# Patient Record
Sex: Female | Born: 1965 | Race: White | Hispanic: No | Marital: Married | Smoking: Never smoker
Health system: Southern US, Community
[De-identification: ages and names within clinical notes are randomized; demographics above are authoritative.]

## PROBLEM LIST (undated history)

## (undated) DIAGNOSIS — E785 Hyperlipidemia, unspecified: Secondary | ICD-10-CM

## (undated) DIAGNOSIS — E079 Disorder of thyroid, unspecified: Secondary | ICD-10-CM

## (undated) HISTORY — DX: Hyperlipidemia, unspecified: E78.5

## (undated) HISTORY — DX: Disorder of thyroid, unspecified: E07.9

## (undated) HISTORY — PX: FOOT SURGERY: SHX648

---

## 1998-10-12 ENCOUNTER — Other Ambulatory Visit: Admission: RE | Admit: 1998-10-12 | Discharge: 1998-10-12 | Payer: Self-pay | Admitting: Obstetrics and Gynecology

## 1999-08-07 ENCOUNTER — Other Ambulatory Visit: Admission: RE | Admit: 1999-08-07 | Discharge: 1999-08-07 | Payer: Self-pay | Admitting: Obstetrics and Gynecology

## 2000-02-16 ENCOUNTER — Encounter (INDEPENDENT_AMBULATORY_CARE_PROVIDER_SITE_OTHER): Payer: Self-pay | Admitting: Specialist

## 2000-02-16 ENCOUNTER — Inpatient Hospital Stay (HOSPITAL_COMMUNITY): Admission: AD | Admit: 2000-02-16 | Discharge: 2000-02-20 | Payer: Self-pay | Admitting: Obstetrics and Gynecology

## 2000-04-01 ENCOUNTER — Other Ambulatory Visit: Admission: RE | Admit: 2000-04-01 | Discharge: 2000-04-01 | Payer: Self-pay | Admitting: Obstetrics and Gynecology

## 2001-04-23 ENCOUNTER — Other Ambulatory Visit: Admission: RE | Admit: 2001-04-23 | Discharge: 2001-04-23 | Payer: Self-pay | Admitting: Obstetrics and Gynecology

## 2001-07-13 ENCOUNTER — Ambulatory Visit (HOSPITAL_COMMUNITY): Admission: RE | Admit: 2001-07-13 | Discharge: 2001-07-13 | Payer: Self-pay | Admitting: Family Medicine

## 2001-07-13 ENCOUNTER — Encounter: Payer: Self-pay | Admitting: Family Medicine

## 2002-05-19 ENCOUNTER — Other Ambulatory Visit: Admission: RE | Admit: 2002-05-19 | Discharge: 2002-05-19 | Payer: Self-pay | Admitting: Obstetrics and Gynecology

## 2002-06-03 ENCOUNTER — Encounter: Payer: Self-pay | Admitting: Cardiovascular Disease

## 2002-06-03 ENCOUNTER — Ambulatory Visit: Admission: RE | Admit: 2002-06-03 | Discharge: 2002-06-03 | Payer: Self-pay | Admitting: Obstetrics and Gynecology

## 2003-06-17 ENCOUNTER — Other Ambulatory Visit: Admission: RE | Admit: 2003-06-17 | Discharge: 2003-06-17 | Payer: Self-pay | Admitting: Obstetrics & Gynecology

## 2004-07-17 ENCOUNTER — Other Ambulatory Visit: Admission: RE | Admit: 2004-07-17 | Discharge: 2004-07-17 | Payer: Self-pay | Admitting: Obstetrics and Gynecology

## 2005-07-09 ENCOUNTER — Other Ambulatory Visit: Admission: RE | Admit: 2005-07-09 | Discharge: 2005-07-09 | Payer: Self-pay | Admitting: Obstetrics and Gynecology

## 2007-11-30 ENCOUNTER — Emergency Department (HOSPITAL_COMMUNITY): Admission: EM | Admit: 2007-11-30 | Discharge: 2007-11-30 | Payer: Self-pay | Admitting: Emergency Medicine

## 2009-01-06 ENCOUNTER — Emergency Department (HOSPITAL_BASED_OUTPATIENT_CLINIC_OR_DEPARTMENT_OTHER): Admission: EM | Admit: 2009-01-06 | Discharge: 2009-01-06 | Payer: Self-pay | Admitting: Emergency Medicine

## 2009-01-06 ENCOUNTER — Ambulatory Visit: Payer: Self-pay | Admitting: Diagnostic Radiology

## 2009-01-12 HISTORY — PX: KNEE SURGERY: SHX244

## 2010-07-30 IMAGING — CR DG KNEE COMPLETE 4+V*R*
4 series · 4 of 4 positions shown · non-contrast
Comparison: None.

CLINICAL DATA: Right knee pain following an injury.

RIGHT KNEE - COMPLETE 4+ VIEW

[t knee lat right]
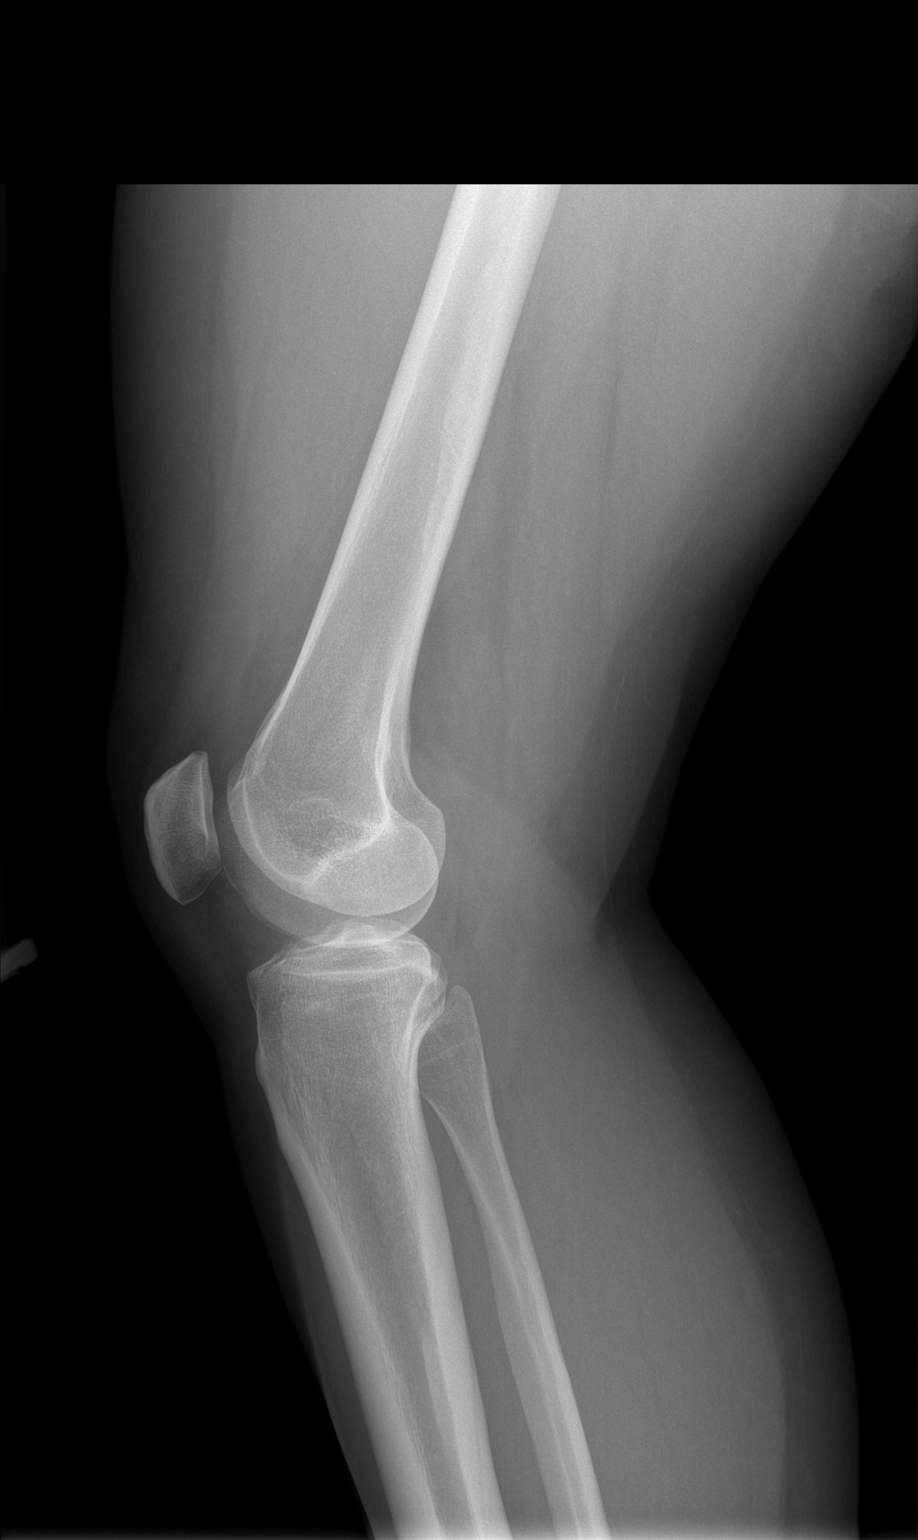

[t knee ap right]
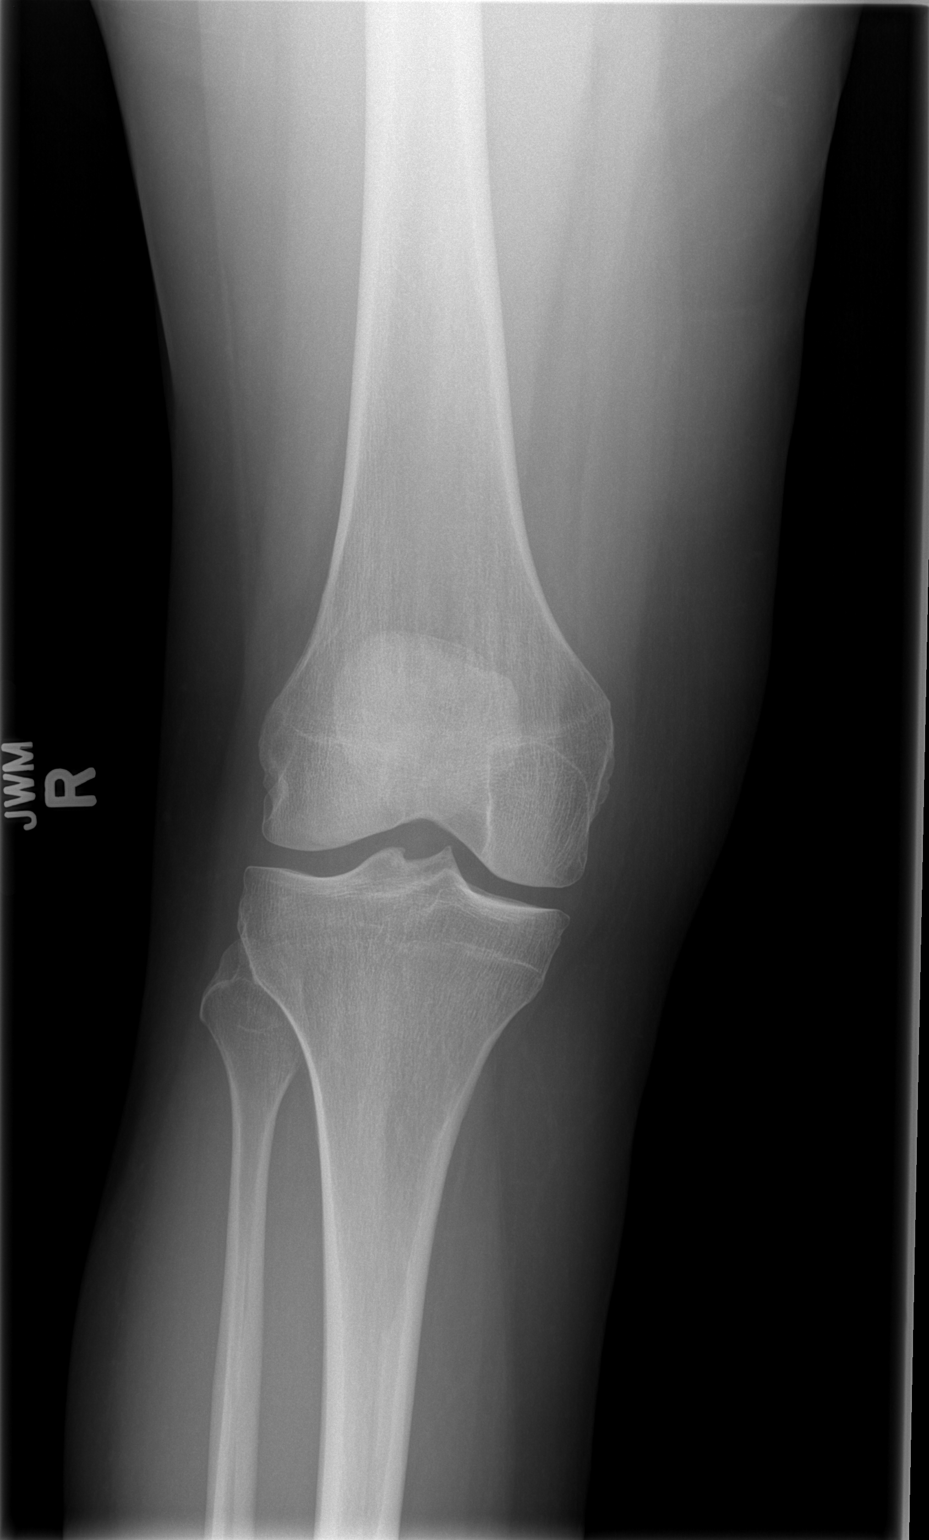

[t knee oblique right (1 of 2)]
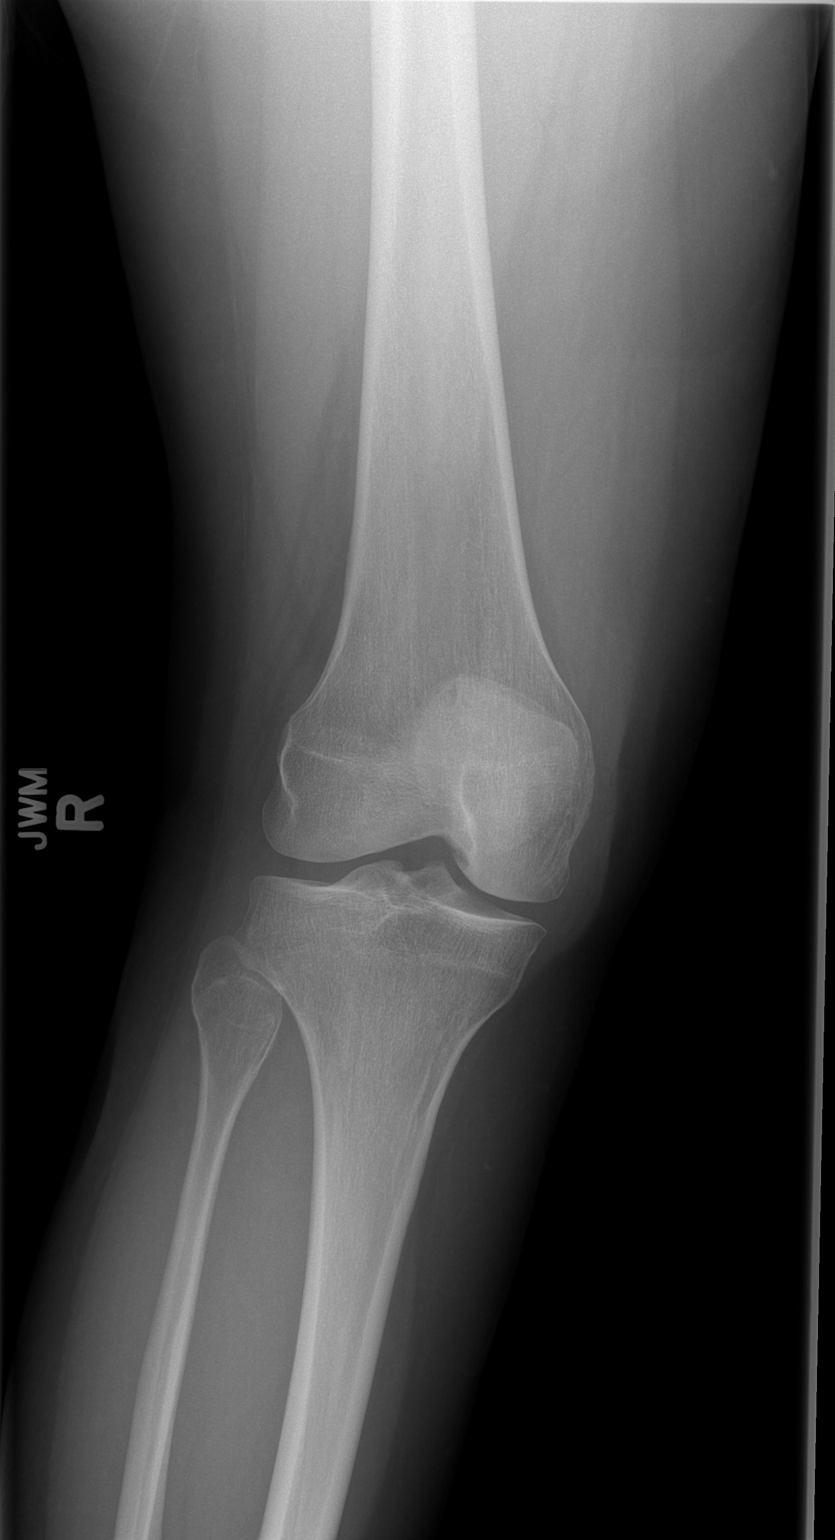

[t knee oblique right (2 of 2)]
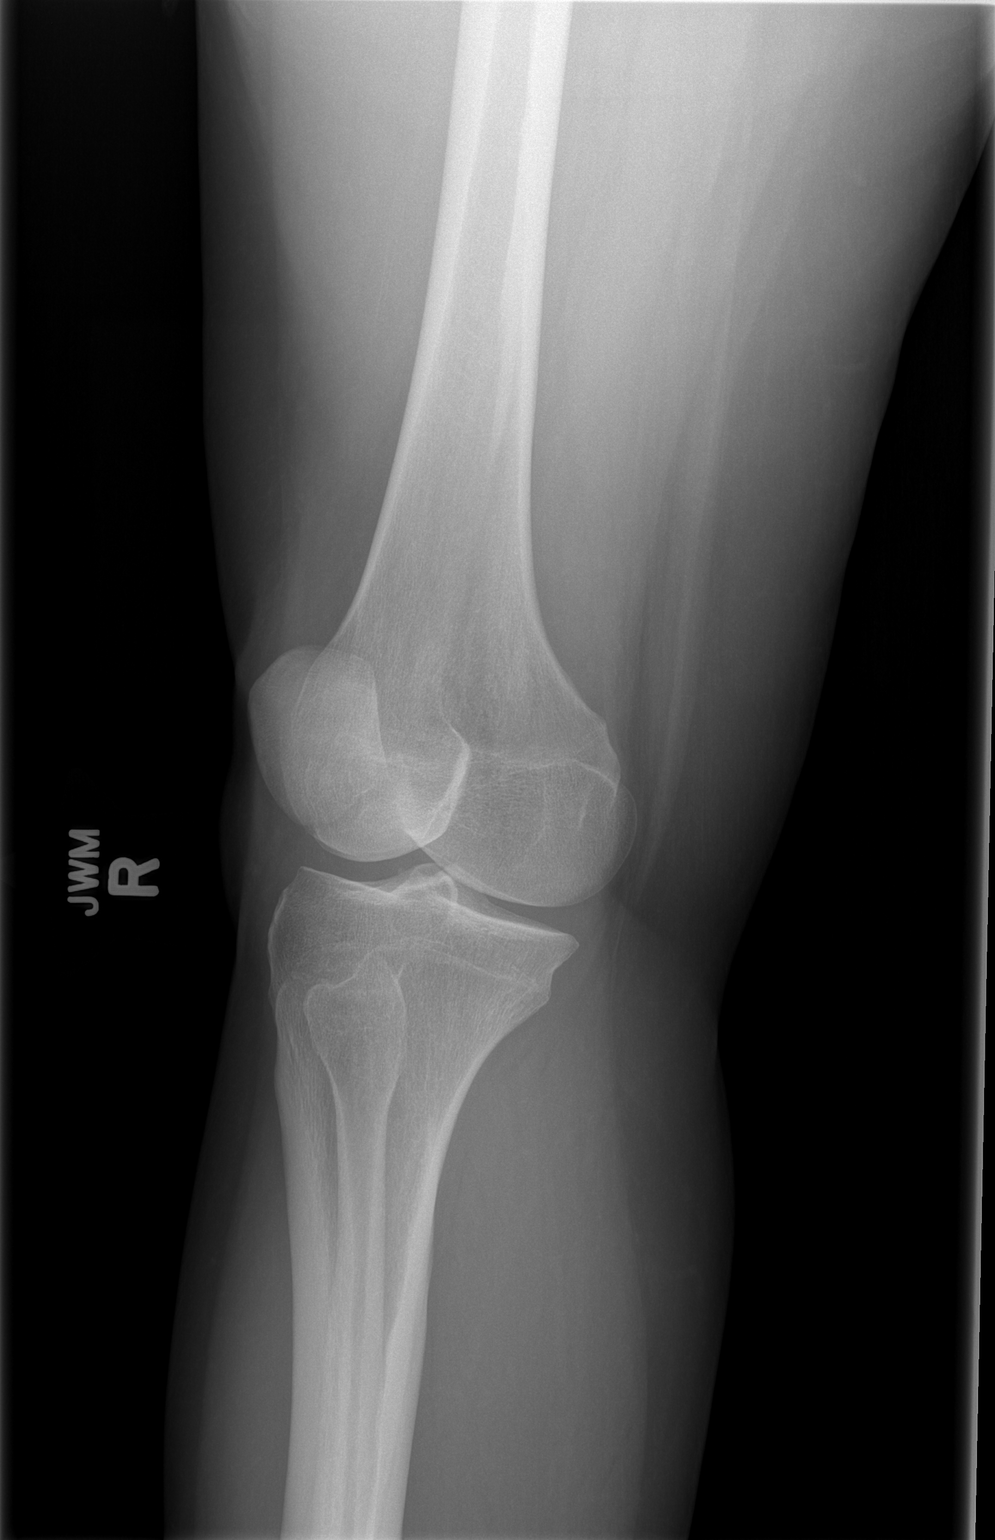

[4 of 4 positions shown; findings below may reference images not displayed]

FINDINGS: Normal appearing bones and soft tissues without fracture,
dislocation or effusion.
IMPRESSION: Normal examination.

## 2010-07-30 IMAGING — CR DG FEMUR 2+V*R*
2 series · 2 of 2 positions shown · non-contrast
Comparison: Right knee radiographs obtained at the same time.

CLINICAL DATA: Right knee pain following an injury.

RIGHT FEMUR - 2 VIEW

[t femur with hip  ap right]
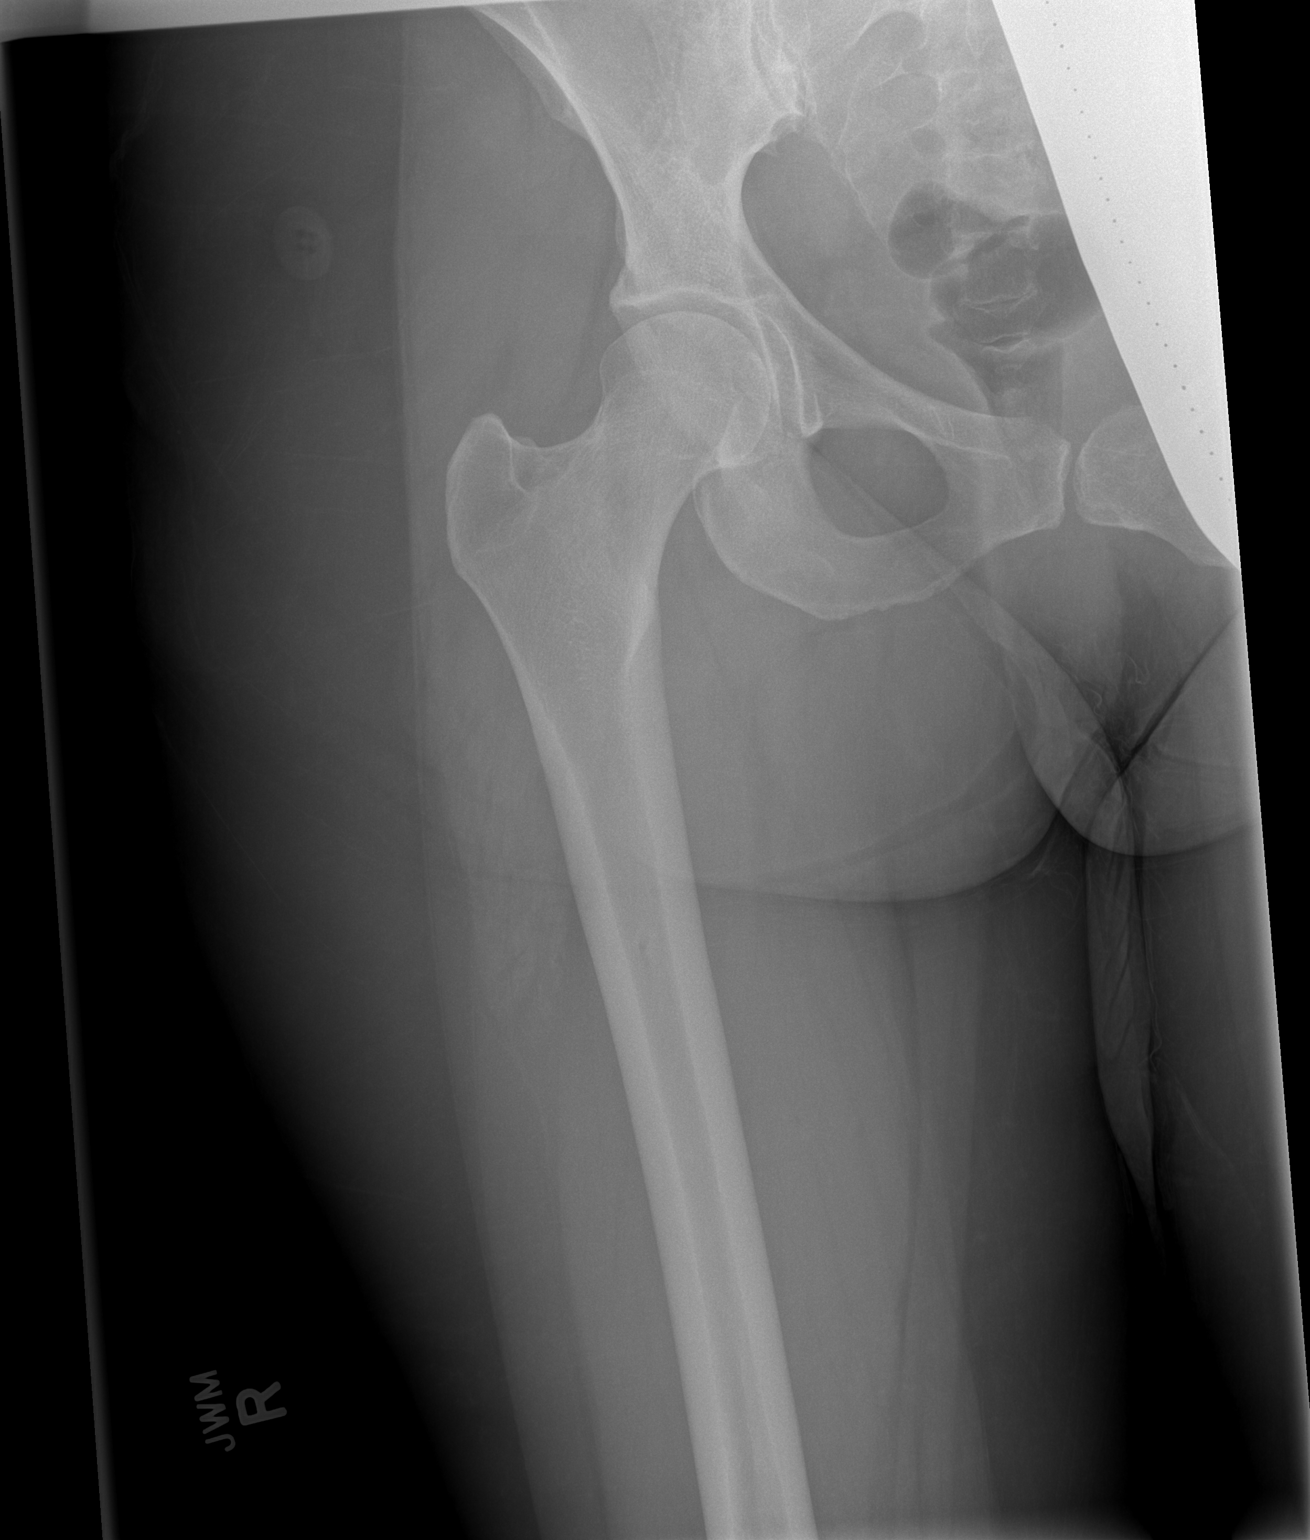

[t femur with hip lat right]
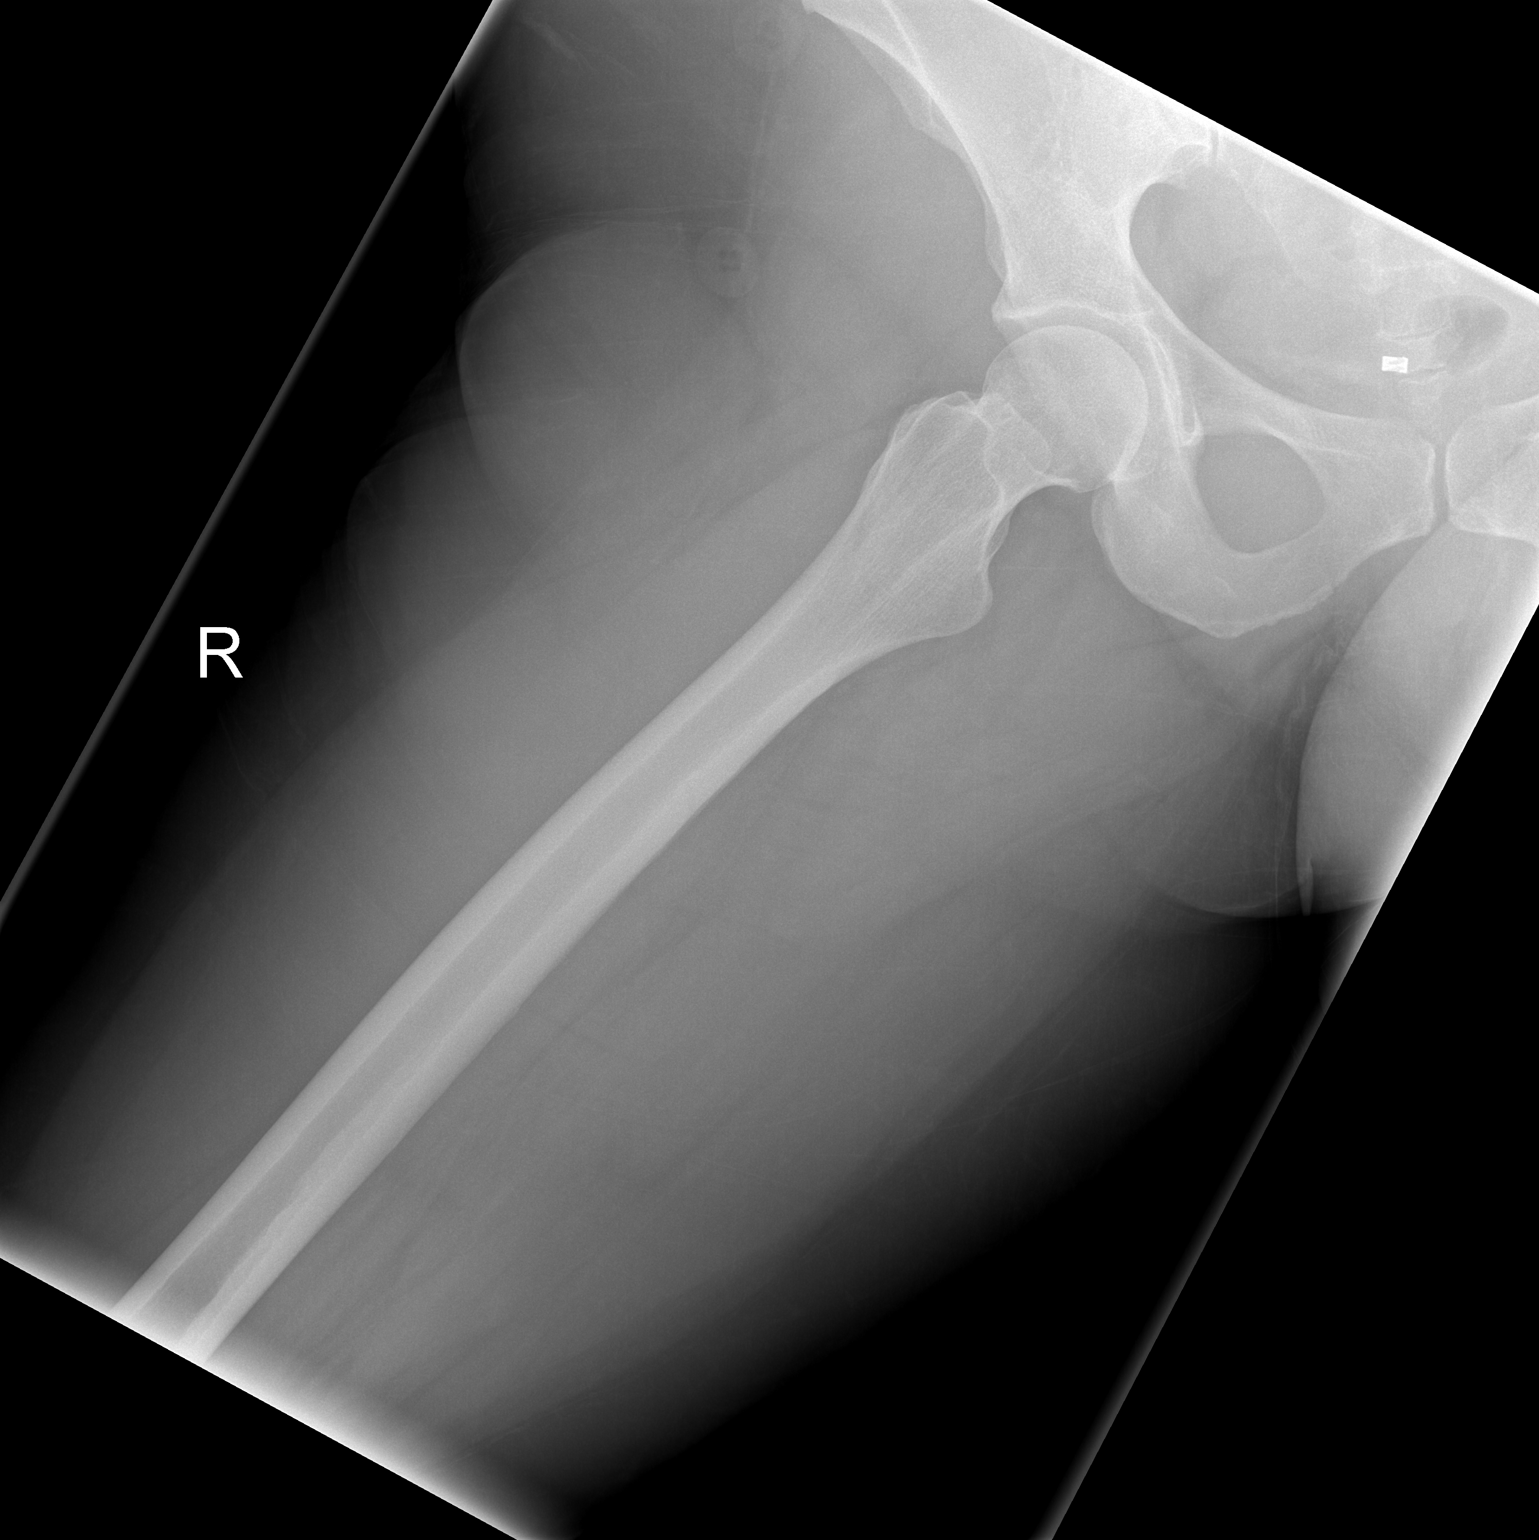

[2 of 2 positions shown; findings below may reference images not displayed]

FINDINGS: Normal appearing bones and soft tissues without fracture
or dislocation.
IMPRESSION: Normal examination.

## 2010-10-14 DIAGNOSIS — E785 Hyperlipidemia, unspecified: Secondary | ICD-10-CM

## 2010-10-14 DIAGNOSIS — E079 Disorder of thyroid, unspecified: Secondary | ICD-10-CM

## 2010-10-14 HISTORY — DX: Disorder of thyroid, unspecified: E07.9

## 2010-10-14 HISTORY — DX: Hyperlipidemia, unspecified: E78.5

## 2010-11-30 NOTE — Op Note (Signed)
[facility]  Patient:    Kimberly Cohen, Kimberly Cohen                      MRN: [id number] Adm. Date:  [id number] Attending:  Donne Hazel                           Operative Report  PREOPERATIVE DIAGNOSES:       Intrauterine pregnancy at 36-1/2 weeks, arrest of dilatation and descent.  POSTOPERATIVE DIAGNOSES:      Intrauterine pregnancy at 36-1/2 weeks, arrest of dilatation and descent.  PROCEDURE:                    Primary cesarean section, low cervical transverse.  SURGEON:                      Beather Arbour. Thomasena Edis, M.D.  FLUIDS:                       1400 cc of crystalloid.  ESTIMATED BLOOD LOSS:         800 cc.  DRAINS:                       Foley.  COMPLICATIONS:                None.  ANESTHESIA:                   Epidural.  DESCRIPTION OF PROCEDURE:     The patient was brought to the operating room and identified on the operating room.  After topping off the patients epidural, she was placed in the left uterine displacement position and prepped and draped in the usual sterile fashion.  A Foley catheter had been previously placed.  A Pfannenstiel incision was made and carried down to the fascia. Fascia was scored in the midline and extended bilaterally using Mayo scissors. It was then separated free from the underlying muscles.  The muscles were separated in the midline down to the symphysis.  The peritoneum was entered sharply and carefully, taking care to avoid bowel or other abdominal contents. The peritoneal incision was then extended superiorly, then inferiorly down to the bladder edge.  The bladder blade was placed and the bladder flap was developed.  The uterus was scored in the lower uterine segment and the incision was extended in a curvilinear fashion using bandage scissors.  The vertex was noted to be wedged way down into the pelvic; in fact, the incision was made over the shoulder.  The vertex was freed from the pelvis  and delivered without difficulty onto the operative field.  The infant was bulb-suctioned and noted to be very vigorous.  The remainder of the infants body was delivered without difficulty onto the operative field.  The cord was doubly clamped and cut and the baby was handed to the awaiting neonatal resuscitation team.  Cord bloods were collected.  A cord pH was sent and found to be 7.32.  The placenta was manually removed and sent to pathology for examination due to maternal temperature.  The uterus was wiped clean of products of conception and exteriorized onto the patients abdomen and wrapped in a wet lap pack.  There was noted to be a fundal fibroid on the left. Uterus, tubes and ovaries appeared otherwise grossly normal.  Ring clamps were placed on the  uterus.  The uterus was repaired using a suture of 0 Monocryl in a simple running-interlocking fashion.  Excellent hemostasis was noted all along the uterine incision.  There was noted to be a small laceration at the left portion of the incision was repaired using three throws of the 0 Monocryl suture in a running-interlocking fashion.  After noting excellent uterine incision hemostasis, the uterus was placed back into the abdominal cavity and irrigated copiously with warm Lactated Ringers; again, excellent hemostasis was noted all along the cervical excision as well as the uterine incision. The bladder flap was closed using a simple running suture of 2-0 Monocryl. Kelly clamps were placed on the peritoneum and the peritoneal cavity was irrigated free of clots.  The peritoneum was closed using a simple running suture of 0 Monocryl, taking care that no bowel loops or omentum were contained within the peritoneal cavity.  The subfascial areas were examined for evidence of hemostasis and excellent hemostasis was achieved using cautery.  After noting excellent subfascial hemostasis, the fascia was closed using a suture of 0 PDS anchored at  the left-most angle of the incision, run to the right-most angle of the incision in a simple running fashion and tied. The subcutaneous tissue was irrigated copiously with warm lactated Ringers and excellent hemostasis was achieved using cautery.  The skin was closed with Steri-Strips and Steri-Strips were applied.  Patient tolerated the procedure well without apparent complications and was transferred to the recovery room in stable condition after all instrument, sponge and needle counts were correct.  The baby was found to be a 6-pound 14-ounce female, Apgars 8/9, cord pH 7.32, and went to the normal newborn nursery.  Patient did have a temperature in labor and had received ampicillin prior to that time due to unknown group B strep status.  She will be continued on Unasyn 3 g IV q.6h. for the next 24 hours to treat the chorioamnionitis. DD:  02/17/00 TD:  02/18/00 Job: [id number]

## 2010-11-30 NOTE — Discharge Summary (Signed)
[facility]  Patient:    Kimberly Cohen, Kimberly Cohen                      MRN: [id number] Adm. Date:  [id number] Disc. Date: [id number] Attending:  Donne Hazel Dictator:   Danie Chandler, R.N.                           Discharge Summary  ADMISSION DIAGNOSIS:                 Intrauterine pregnancy at 36-3/[redacted] weeks gestation with spontaneous rupture of membranes in early labor.  DISCHARGE DIAGNOSES:          Intrauterine pregnancy at 36-3/[redacted] weeks gestation with spontaneous rupture of membranes in early labor with arrest of dilatation and descent.  PROCEDURE:                    On February 17, 2000, primary low cervical transverse cesarean section.  HISTORY OF PRESENT ILLNESS:   The patient is a 45 year old female, gravida 2, para 0, at 36-3/7 weeks, who was admitted with spontaneous rupture of membranes.  The patients pregnancy has been complicated by advanced maternal age for which she declined genetic work-up.  HOSPITAL COURSE:              The patient was admitted and did receive IV antibiotics for group B streptococcus coverage.  She progressed throughout the day on February 17, 2000, with Pitocin augmentation.  Later that afternoon, the decision was made to proceed with primary cesarean section for arrest of dilation and arrest of descent.  The patient was taken to the operating room and underwent the above-named procedure without complication.  This was productive of a viable female infant with Apgars of 8 at one minute and 9 at five minutes and an arterial cord pH of 7.32.  The patient was continued on antibiotics postoperatively due to increased temperature in labor.  On postoperative day #1, the patients hemoglobin was 9.1.  She had a good return of bowel function and she was started on iron daily.  On postoperative day #2, the patient was tolerating a regular diet and ambulating well without difficulty and had good pain control. She was discharged home  on postoperative day #3.  CONDITION ON DISCHARGE:       Good.  DIET:                         Regular as tolerated.  ACTIVITY:                     No heavy lifting.  No driving.  No vaginal entry.  FOLLOW-UP:                    She is to follow up in the office in one to two weeks for an incision check.  She is to call for temperature greater than 100 degrees, persistent nausea or vomiting, heavy vaginal bleeding, and/or redness or drainage from the incision site.  DISCHARGE MEDICATIONS:        1. Tylox, #40, one to two p.o. q.4h. p.r.n.                                  pain.  2. Motrin, #30, 600 mg one p.o. q.6h. p.r.n.                                  pain. DD:  03/06/00 TD:  03/07/00 Job: [id number]

## 2011-02-04 ENCOUNTER — Encounter: Payer: Self-pay | Admitting: *Deleted

## 2011-02-04 ENCOUNTER — Encounter: Payer: 59 | Attending: Obstetrics and Gynecology | Admitting: *Deleted

## 2011-02-04 DIAGNOSIS — E789 Disorder of lipoprotein metabolism, unspecified: Secondary | ICD-10-CM | POA: Insufficient documentation

## 2011-02-04 DIAGNOSIS — Z713 Dietary counseling and surveillance: Secondary | ICD-10-CM | POA: Insufficient documentation

## 2011-02-04 NOTE — Patient Instructions (Addendum)
Goals:  Increase protein rich foods  Limit fat to 30-35g and saturated fat to 10g or less daily  Aim for 25-30g of fiber daily.   Choose more whole grains, lean protein, low-fat dairy, and fruits/non-starchy vegetables.   Continue to aim for 60 min of physical activity daily  Limit sugar-sweetened beverages and concentrated sweets  Follow up with me as needed.

## 2011-02-04 NOTE — Progress Notes (Signed)
  Medical Nutrition Therapy:  Appt start time: 08:00a end time:  09:00a.   Assessment:  Primary concerns today: Elevated Lipids (LDL, TC). Pt states she and MD believe lipids are elevated d/t thyroid dysfunction dx in April 2012. Dietary recall shows appropriate food choices, however increased intake of sweetened beverages/ice cream noted.  Reports she rarely eats out and attends Zumba classes ~2x/week during the summer and 3-4x/week during the rest of the year.  Pt is unaware of any family hx of hyperlipidemia.  MEDICATIONS: Levothyroxine, loratadine, ZXT Slim (Bee Pollen), Boost and Burn (Fat Burner)   DIETARY INTAKE:  Usual eating pattern includes 3 meals and 0-1 snacks per day.  24-hr recall:  B ( AM): Apple, 2 slices cheese, water  Snk ( AM): none  L ( PM): Salad w/ chicken or sandwich; Chick[employer]fil[employer]a salad w/ thousand island; sweet tea or water Snk ( PM): not usually; apple or chocolate pudding D ( PM): Manwich and tater tots; Baked chicken, salad, baked beans; sweet tea/water/lemonade Snk ( PM): ice cream  - changing to [employer]  Usual physical activity:  Zumba 2x/week (summer) or 3-4x/week (fall)  Estimated energy needs: 1200 calories (for wt loss) 150 g carbohydrates 75 g protein 30-35 g fat <10 g saturated fat 25-30 g fiber <300 mg cholesterol  Progress Towards Goal(s):  New.   Nutritional Diagnosis:  [place]-2.2 Altered nutrition-related labs related to dietary intake as evidenced by decreased fiber intake and MD referral for elevated lipids.    Intervention/Goals:  Increase protein rich foods  Limit fat to 30-35g and saturated fat to 10g or less daily  Aim for 25-30g of fiber daily.   Choose more whole grains, lean protein, low-fat dairy, and fruits/non-starchy vegetables.   Continue to aim for 60 min of physical activity daily  Limit sugar-sweetened beverages and concentrated sweets  Follow up with me as needed.   Monitoring/Evaluation:  Dietary  intake, exercise, lipid panel, and body weight prn.

## 2011-11-15 ENCOUNTER — Other Ambulatory Visit: Payer: Self-pay | Admitting: Obstetrics and Gynecology

## 2011-11-15 DIAGNOSIS — R928 Other abnormal and inconclusive findings on diagnostic imaging of breast: Secondary | ICD-10-CM

## 2011-11-19 ENCOUNTER — Ambulatory Visit
Admission: RE | Admit: 2011-11-19 | Discharge: 2011-11-19 | Disposition: A | Discharge status: Home / Self Care | Payer: 59 | Source: Ambulatory Visit | Attending: Obstetrics and Gynecology | Admitting: Obstetrics and Gynecology

## 2011-11-19 ENCOUNTER — Other Ambulatory Visit: Payer: Self-pay | Admitting: Obstetrics and Gynecology

## 2011-11-19 DIAGNOSIS — N6320 Unspecified lump in the left breast, unspecified quadrant: Secondary | ICD-10-CM

## 2011-11-19 DIAGNOSIS — R928 Other abnormal and inconclusive findings on diagnostic imaging of breast: Secondary | ICD-10-CM

## 2013-06-11 IMAGING — MG MM DIAGNOSTIC UNILATERAL L
2 series · 2 of 2 positions shown · non-contrast
Comparison: 11/12/2011

CLINICAL DATA: focally dilated duct with possible intraductal
mass 7 o'clock position left breast

DIGITAL DIAGNOSTIC LEFT MAMMOGRAM

[L CC]
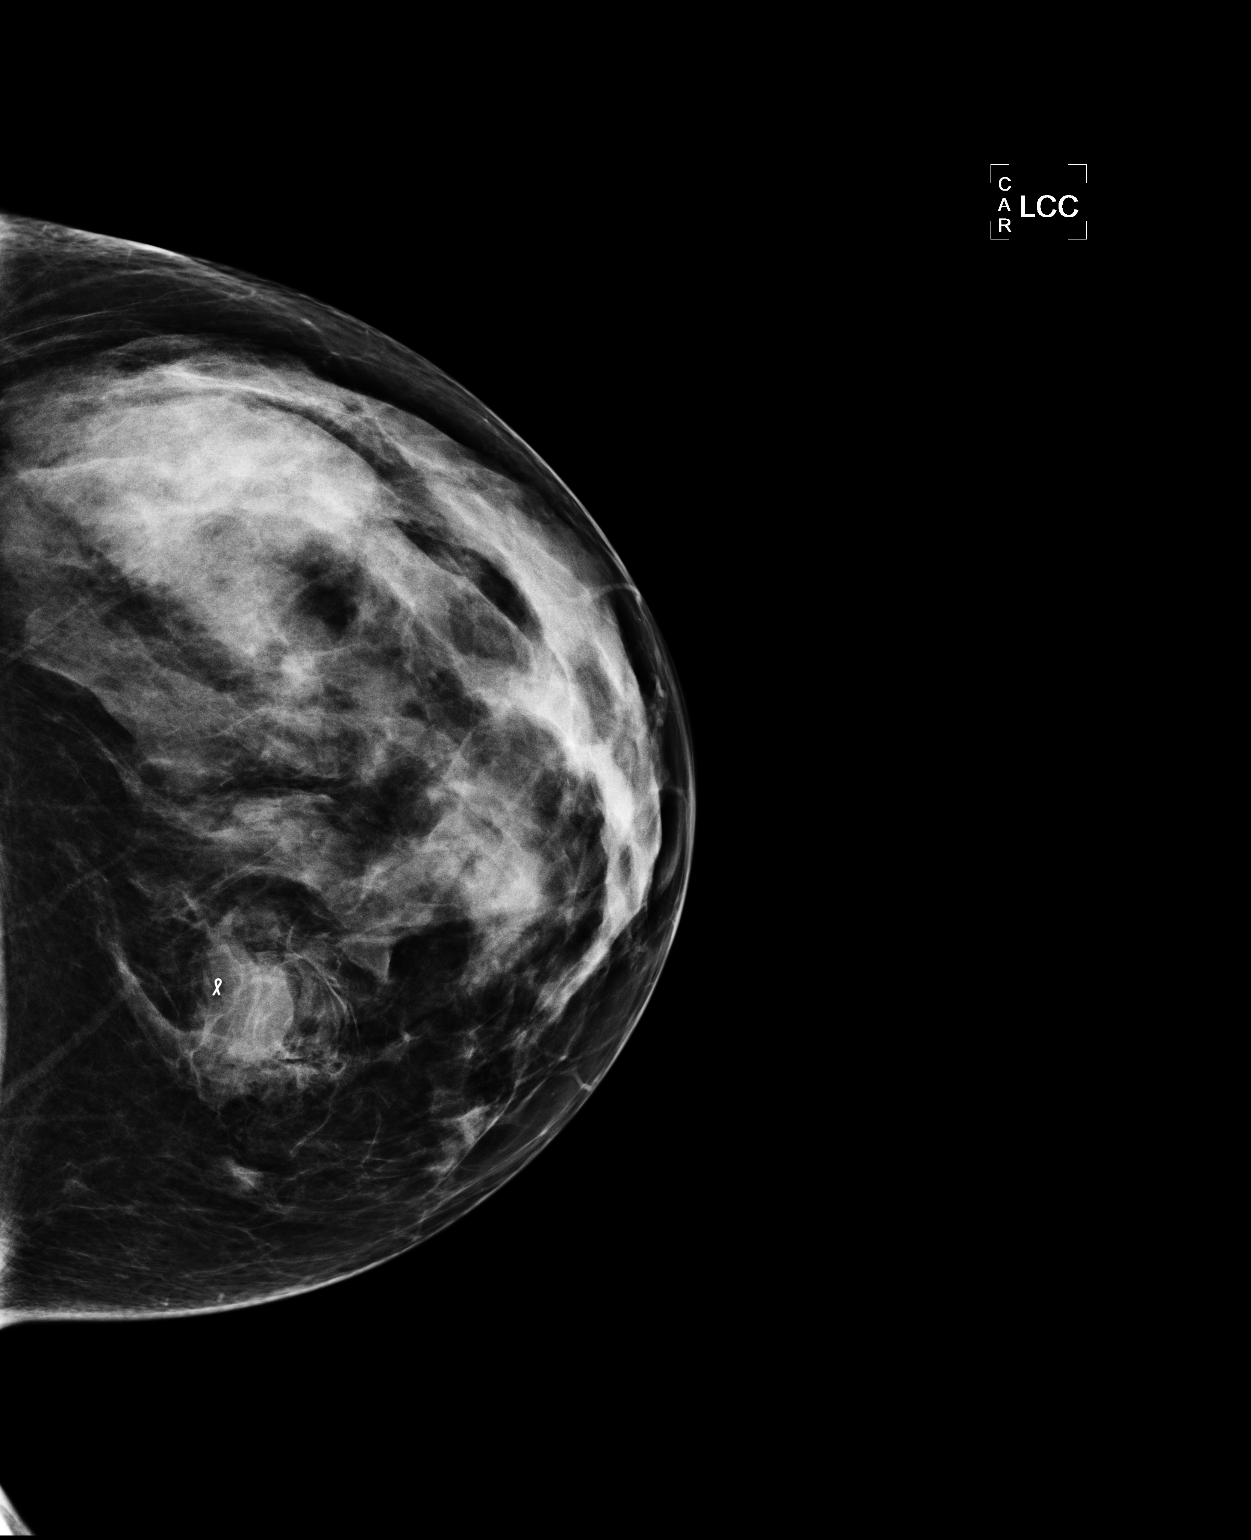

[L ML]
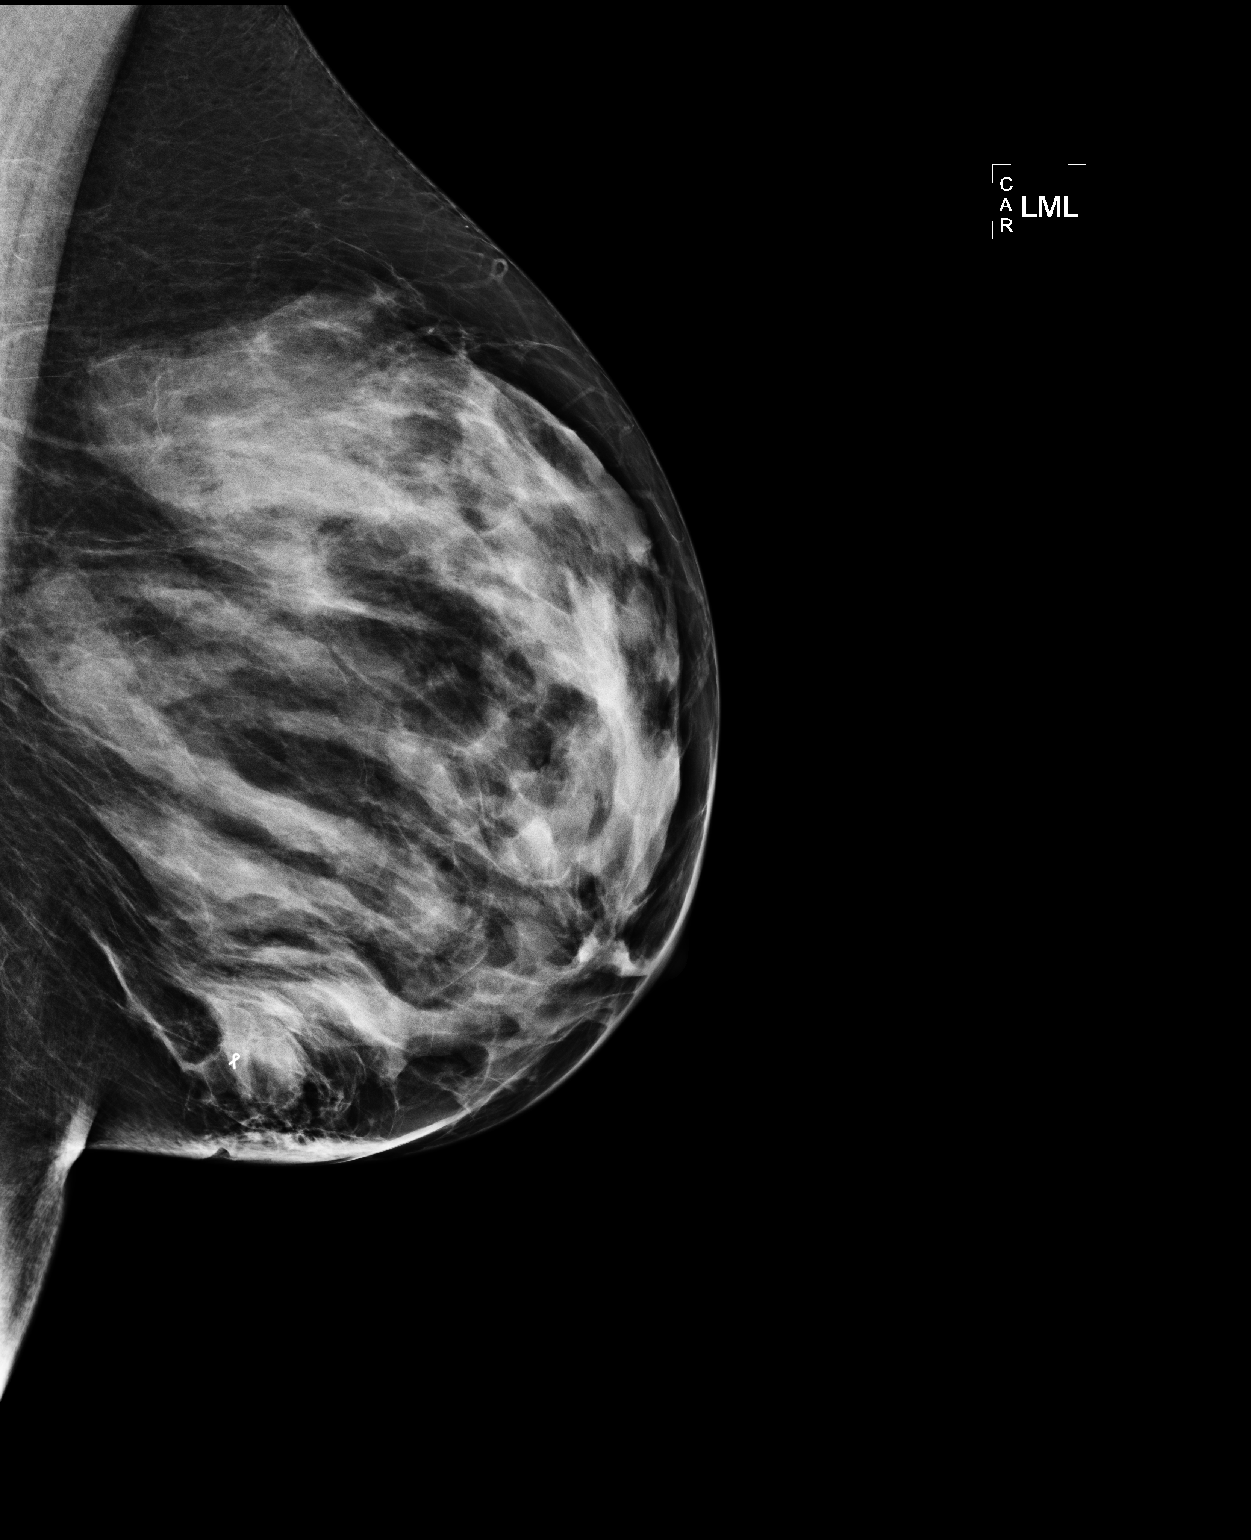

[2 of 2 positions shown; findings below may reference images not displayed]

FINDINGS: Films are performed following pain ultras guided biopsy
of left breast abnormality.  Marker clip projects over the area of
mammographic interest.
IMPRESSION: Appropriate clip placement.

## 2014-01-21 ENCOUNTER — Other Ambulatory Visit: Payer: Self-pay | Admitting: Obstetrics and Gynecology

## 2014-12-23 ENCOUNTER — Other Ambulatory Visit: Payer: Self-pay | Admitting: Obstetrics and Gynecology

## 2014-12-27 LAB — CYTOLOGY - PAP

## 2017-01-02 DIAGNOSIS — E039 Hypothyroidism, unspecified: Secondary | ICD-10-CM | POA: Diagnosis not present

## 2017-01-16 DIAGNOSIS — Z01419 Encounter for gynecological examination (general) (routine) without abnormal findings: Secondary | ICD-10-CM | POA: Diagnosis not present

## 2017-01-16 DIAGNOSIS — Z1231 Encounter for screening mammogram for malignant neoplasm of breast: Secondary | ICD-10-CM | POA: Diagnosis not present

## 2017-02-11 DIAGNOSIS — J209 Acute bronchitis, unspecified: Secondary | ICD-10-CM | POA: Diagnosis not present

## 2017-07-03 DIAGNOSIS — E039 Hypothyroidism, unspecified: Secondary | ICD-10-CM | POA: Diagnosis not present

## 2018-01-05 DIAGNOSIS — Z13220 Encounter for screening for lipoid disorders: Secondary | ICD-10-CM | POA: Diagnosis not present

## 2018-01-05 DIAGNOSIS — Z0000 Encounter for general adult medical examination without abnormal findings: Secondary | ICD-10-CM | POA: Diagnosis not present

## 2018-01-05 DIAGNOSIS — E039 Hypothyroidism, unspecified: Secondary | ICD-10-CM | POA: Diagnosis not present

## 2018-01-22 DIAGNOSIS — Z01419 Encounter for gynecological examination (general) (routine) without abnormal findings: Secondary | ICD-10-CM | POA: Diagnosis not present

## 2018-01-22 DIAGNOSIS — Z1231 Encounter for screening mammogram for malignant neoplasm of breast: Secondary | ICD-10-CM | POA: Diagnosis not present

## 2018-01-22 DIAGNOSIS — Z6831 Body mass index (BMI) 31.0-31.9, adult: Secondary | ICD-10-CM | POA: Diagnosis not present

## 2018-04-10 DIAGNOSIS — D124 Benign neoplasm of descending colon: Secondary | ICD-10-CM | POA: Diagnosis not present

## 2018-04-10 DIAGNOSIS — D122 Benign neoplasm of ascending colon: Secondary | ICD-10-CM | POA: Diagnosis not present

## 2018-04-10 DIAGNOSIS — Z1211 Encounter for screening for malignant neoplasm of colon: Secondary | ICD-10-CM | POA: Diagnosis not present

## 2018-04-10 DIAGNOSIS — D120 Benign neoplasm of cecum: Secondary | ICD-10-CM | POA: Diagnosis not present

## 2022-05-13 ENCOUNTER — Other Ambulatory Visit: Payer: Self-pay | Admitting: Obstetrics and Gynecology

## 2022-05-13 DIAGNOSIS — Z8249 Family history of ischemic heart disease and other diseases of the circulatory system: Secondary | ICD-10-CM

## 2022-07-05 ENCOUNTER — Other Ambulatory Visit: Payer: Self-pay

## 2022-08-02 ENCOUNTER — Ambulatory Visit
Admission: RE | Admit: 2022-08-02 | Discharge: 2022-08-02 | Disposition: A | Discharge status: Home / Self Care | Payer: 59 | Source: Ambulatory Visit | Attending: Obstetrics and Gynecology | Admitting: Obstetrics and Gynecology

## 2022-08-02 DIAGNOSIS — Z8249 Family history of ischemic heart disease and other diseases of the circulatory system: Secondary | ICD-10-CM

## 2022-09-12 DIAGNOSIS — I77819 Aortic ectasia, unspecified site: Secondary | ICD-10-CM | POA: Insufficient documentation

## 2022-09-12 NOTE — Progress Notes (Addendum)
Cardiology Office Note   Date:  09/13/2022   ID:  MOSSIE WOWK, DOB September 01, 1965, MRN [id number]  PCP:  Arvella Nigh, MD  Cardiologist:   None Referring:  Arvella Nigh, MD  Chief Complaint  Patient presents with   Abnormal CT      History of Present Illness: Kimberly Cohen is a 57 y.o. female who is referred by Arvella Nigh, MD for evaluation of ascending aortic aneurysm.    She had a coronary calcium score.  This was zero although the aorta was enlarged at 43 mm.    She has no past history of cardiac disease.  The above testing was done for screening.  She denies any cardiovascular symptoms.  She sits at a computer 9 hours a day but she does a lot of yard work on the weekend and walks the dogs.  With this she denies any chest pressure, neck or arm discomfort.  She never had any palpitations, presyncope or syncope.  She denies any shortness of breath, PND or orthopnea.  She has had no weight gain or edema.   Past Medical History:  Diagnosis Date   Hyperlipidemia 10/2010   Elevated LDL and TC per MD   Thyroid disease 10/2010   Underactive thyroid per pt    Past Surgical History:  Procedure Laterality Date   FOOT SURGERY     KNEE SURGERY  01/12/2009     Current Outpatient Medications  Medication Sig Dispense Refill   DULoxetine (CYMBALTA) 60 MG capsule Take 60 mg by mouth daily.     levothyroxine (SYNTHROID, LEVOTHROID) 50 MCG tablet Take 50 mcg by mouth daily.       loratadine (CLARITIN) 10 MG tablet Take 10 mg by mouth daily.       Turmeric 500 MG CAPS Take 500 mg by mouth daily.     VITAMIN D PO Take by mouth daily.     Zinc 100 MG TABS Take 100 mg by mouth daily.     No current facility-administered medications for this visit.    Allergies:   Codeine    Social History:  The patient  reports that she has never smoked. She has never used smokeless tobacco. She reports that she does not use drugs.   Family History:  The patient's family history includes COPD in  her maternal aunt; Cancer in her maternal grandmother and mother; Diabetes in her father and sister; Heart attack (age of onset: 75) in her sister.    ROS:  Please see the history of present illness.   Otherwise, review of systems are positive for none.   All other systems are reviewed and negative.    PHYSICAL EXAM: VS:  BP (!) 142/100   Pulse 75   Ht '5\' 3"'$  (1.6 m)   Wt 183 lb (83 kg)   SpO2 96%   BMI 32.42 kg/m  , BMI Body mass index is 32.42 kg/m. GENERAL:  Well appearing HEENT:  Pupils equal round and reactive, fundi not visualized, oral mucosa unremarkable NECK:  No jugular venous distention, waveform within normal limits, carotid upstroke brisk and symmetric, no bruits, no thyromegaly LYMPHATICS:  No cervical, inguinal adenopathy LUNGS:  Clear to auscultation bilaterally BACK:  No CVA tenderness CHEST:  Unremarkable HEART:  PMI not displaced or sustained,S1 and S2 within normal limits, no S3, no S4, no clicks, no rubs, no murmurs ABD:  Flat, positive bowel sounds normal in frequency in pitch, no bruits, no rebound, no guarding, no midline  pulsatile mass, no hepatomegaly, no splenomegaly EXT:  2 plus pulses throughout, no edema, no cyanosis no clubbing SKIN:  No rashes no nodules NEURO:  Cranial nerves II through XII grossly intact, motor grossly intact throughout PSYCH:  Cognitively intact, oriented to person place and time   EKG:  EKG is ordered today. The ekg ordered today demonstrates sinus rhythm, rate 75, axis within normal limits, intervals within normal limits, no acute ST T wave changes.   Recent Labs: No results found for requested labs within last 365 days.    Lipid Panel No results found for: "CHOL", "TRIG", "HDL", "CHOLHDL", "VLDL", "LDLCALC", "LDLDIRECT"    Wt Readings from Last 3 Encounters:  09/13/22 183 lb (83 kg)  02/04/11 170 lb 3.2 oz (77.2 kg)      Other studies Reviewed: Additional studies/ records that were reviewed today include: Office  records. Review of the above records demonstrates:  Please see elsewhere in the note.     ASSESSMENT AND PLAN:  Ascending aortic dilatation:  I will follow up in one year with a CT aortogram.  No further imaging is indicated at this time.  Dyslipidemia: LDL was 164 with an HDL of 49.  She has no coronary artery calcium.  I would still suggest significant changes toward a plant-based diet and then repeat lipid profile.  HTN: Her blood pressure is elevated but she says this is unusual.  She will keep a blood pressure diary.  She can return this after couple of weeks.   Current medicines are reviewed at length with the patient today.  The patient does not have concerns regarding medicines.  The following changes have been made:  no change  Labs/ tests ordered today include:   Orders Placed This Encounter  Procedures   EKG 12-Lead     Disposition:   FU with me in one year.     Signed, Minus Breeding, MD  09/13/2022 2:32 PM    [place]

## 2022-09-13 ENCOUNTER — Encounter: Payer: Self-pay | Admitting: Cardiology

## 2022-09-13 ENCOUNTER — Ambulatory Visit: Payer: 59 | Attending: Cardiology | Admitting: Cardiology

## 2022-09-13 VITALS — BP 142/100 | HR 75 | Ht 63.0 in | Wt 183.0 lb

## 2022-09-13 DIAGNOSIS — I77819 Aortic ectasia, unspecified site: Secondary | ICD-10-CM | POA: Diagnosis not present

## 2022-09-13 NOTE — Patient Instructions (Signed)
    Follow-Up: At [facility], you and your health needs are our priority.  As part of our continuing mission to provide you with exceptional heart care, we have created designated Provider Care Teams.  These Care Teams include your primary Cardiologist (physician) and Advanced Practice Providers (APPs -  Physician Assistants and Nurse Practitioners) who all work together to provide you with the care you need, when you need it.  We recommend signing up for the patient portal called "MyChart".  Sign up information is provided on this After Visit Summary.  MyChart is used to connect with patients for Virtual Visits (Telemedicine).  Patients are able to view lab/test results, encounter notes, upcoming appointments, etc.  Non-urgent messages can be sent to your provider as well.   To learn more about what you can do with MyChart, go to [email or web address].    Your next appointment:   12 month(s)  Provider:   James Hochrein MD   

## 2023-06-24 ENCOUNTER — Other Ambulatory Visit: Payer: Self-pay | Admitting: *Deleted

## 2023-06-24 DIAGNOSIS — I77810 Thoracic aortic ectasia: Secondary | ICD-10-CM

## 2023-07-15 ENCOUNTER — Ambulatory Visit
Admission: RE | Admit: 2023-07-15 | Discharge: 2023-07-15 | Disposition: A | Discharge status: Home / Self Care | Payer: Commercial Managed Care - HMO | Source: Ambulatory Visit | Attending: Cardiology | Admitting: Cardiology

## 2023-07-15 DIAGNOSIS — I77810 Thoracic aortic ectasia: Secondary | ICD-10-CM

## 2023-07-15 MED ORDER — IOPAMIDOL (ISOVUE-370) INJECTION 76%
75.0000 mL | Freq: Once | INTRAVENOUS | Status: AC | PRN
  Administered 2023-07-15: 75 mL via INTRAVENOUS

## 2023-07-17 ENCOUNTER — Encounter: Payer: Self-pay | Admitting: Cardiology

## 2023-07-17 DIAGNOSIS — I77810 Thoracic aortic ectasia: Secondary | ICD-10-CM

## 2023-07-18 NOTE — Telephone Encounter (Signed)
 Left detailed message and to call back if needed. Someone will be calling to schedule CT appt.  Annual CT order entered, f/u CT-adrenal gland protocol entered.

## 2024-03-04 NOTE — Progress Notes (Unsigned)
  Cardiology Office Note:   Date:  03/05/2024  ID:  Kimberly Cohen, DOB 09-07-1965, MRN [id number] PCP: Leva Rush, MD  [facility] Providers Cardiologist:  None {  History of Present Illness:   Kimberly Cohen is a 57 y.o. female who is referred by Leva Rush, MD for evaluation of ascending aortic aneurysm.    She had a coronary calcium score.  This was zero although the aorta was enlarged at 43 mm.  The last CT in Dec 2024 the aorta was 41 mm.  There was also a small adrenal nodule that was to be followed up.  She did not follow-up with this.  She had significant cost because her insurance did not cover the entire cost of the CT when she had it.  She has been active.  She has a treadmill at home. The patient denies any new symptoms such as chest discomfort, neck or arm discomfort. There has been no new shortness of breath, PND or orthopnea. There have been no reported palpitations, presyncope or syncope.   ROS: As stated in the HPI and negative for all other systems.  Studies Reviewed:    EKG:   EKG Interpretation Date/Time:  Friday March 05 2024 13:38:10 EDT Ventricular Rate:  82 PR Interval:  162 QRS Duration:  86 QT Interval:  384 QTC Calculation: 448 R Axis:   1  Text Interpretation: Normal sinus rhythm Poor anterior R wave progression No significant change since last tracing 2024 EKG Confirmed by Lavona Agent ([id number]) on 03/05/2024 1:52:11 PM    Risk Assessment/Calculations:              Physical Exam:   VS:  BP 126/78   Pulse 82   Ht 5' 3 (1.6 m)   Wt 180 lb (81.6 kg)   SpO2 97%   BMI 31.89 kg/m    Wt Readings from Last 3 Encounters:  03/05/24 180 lb (81.6 kg)  09/13/22 183 lb (83 kg)  02/04/11 170 lb 3.2 oz (77.2 kg)     GEN: Well nourished, well developed in no acute distress NECK: No JVD; No carotid bruits CARDIAC: RRR, no murmurs, rubs, gallops RESPIRATORY:  Clear to auscultation without rales, wheezing or rhonchi  ABDOMEN: Soft,  non-tender, non-distended EXTREMITIES:  No edema; No deformity   ASSESSMENT AND PLAN:       Ascending aortic dilatation:   The aorta was 41 mm in Dec 2024.  I will check a follow-up aortic gram in December.  Provide this is stable I probably would do this every couple of years because cost is a significant barrier with her insurance.    Dyslipidemia: LDL was 164 previously but she had no coronary calcium and wanted to try plant forward diet.  Have encouraged her to get a follow-up lipid to see where we are with that.  She is going to have this done by her primary doctor when she sees him soon.    HTN: Her blood pressure is at target.  No change in therapy.  Adrenal nodule: I have requested abdominal CT when she is getting another CT to follow-up on this.  Follow up with me in 2 years based on the results of the above.  Signed, Agent Lavona, MD

## 2024-03-05 ENCOUNTER — Encounter: Payer: Self-pay | Admitting: Cardiology

## 2024-03-05 ENCOUNTER — Ambulatory Visit: Attending: Cardiology | Admitting: Cardiology

## 2024-03-05 VITALS — BP 126/78 | HR 82 | Ht 63.0 in | Wt 180.0 lb

## 2024-03-05 DIAGNOSIS — I77810 Thoracic aortic ectasia: Secondary | ICD-10-CM

## 2024-03-05 DIAGNOSIS — E785 Hyperlipidemia, unspecified: Secondary | ICD-10-CM

## 2024-03-05 DIAGNOSIS — I10 Essential (primary) hypertension: Secondary | ICD-10-CM | POA: Diagnosis not present

## 2024-03-05 NOTE — Patient Instructions (Signed)
 Medication Instructions:  Continue same medications *If you need a refill on your cardiac medications before your next appointment, please call your pharmacy*  Lab Work: None orderd  Testing/Procedures: Schedule CT of chest and CT of abdomen in 06/2024  Follow-Up: At [facility], you and your health needs are our priority.  As part of our continuing mission to provide you with exceptional heart care, our providers are all part of one team.  This team includes your primary Cardiologist (physician) and Advanced Practice Providers or APPs (Physician Assistants and Nurse Practitioners) who all work together to provide you with the care you need, when you need it.  Your next appointment:  2 years    Provider:  Dr.Hochrein   We recommend signing up for the patient portal called MyChart.  Sign up information is provided on this After Visit Summary.  MyChart is used to connect with patients for Virtual Visits (Telemedicine).  Patients are able to view lab/test results, encounter notes, upcoming appointments, etc.  Non-urgent messages can be sent to your provider as well.   To learn more about what you can do with MyChart, go to [email or web address].

## 2024-06-18 LAB — BASIC METABOLIC PANEL WITH GFR
BUN/Creatinine Ratio: 22 (ref 12–28)
BUN: 14 mg/dL (ref 8–27)
CO2: 23 mmol/L (ref 20–29)
Calcium: 10.1 mg/dL (ref 8.7–10.3)
Chloride: 102 mmol/L (ref 96–106)
Creatinine, Ser: 0.63 mg/dL (ref 0.57–1.00)
Glucose: 96 mg/dL (ref 70–99)
Potassium: 4.3 mmol/L (ref 3.5–5.2)
Sodium: 141 mmol/L (ref 134–144)
eGFR: 101 mL/min/1.73 (ref >59)

## 2024-06-20 ENCOUNTER — Ambulatory Visit: Payer: Self-pay | Admitting: Cardiology

## 2024-06-25 ENCOUNTER — Encounter (HOSPITAL_COMMUNITY): Payer: Self-pay

## 2024-06-25 ENCOUNTER — Ambulatory Visit (HOSPITAL_COMMUNITY)
Admission: RE | Admit: 2024-06-25 | Discharge: 2024-06-25 | Payer: Self-pay | Attending: Cardiology | Admitting: Cardiology

## 2024-06-25 DIAGNOSIS — I77810 Thoracic aortic ectasia: Secondary | ICD-10-CM

## 2024-06-25 MED ORDER — IOHEXOL 350 MG/ML SOLN
80.0000 mL | Freq: Once | INTRAVENOUS | Status: AC | PRN
  Administered 2024-06-25: 80 mL via INTRAVENOUS

## 2024-06-25 MED ORDER — SODIUM CHLORIDE (PF) 0.9 % IJ SOLN
INTRAMUSCULAR | Status: AC
  Filled 2024-06-25: qty 50

## 2024-06-27 ENCOUNTER — Ambulatory Visit: Payer: Self-pay | Admitting: Cardiology

## 2024-06-27 DIAGNOSIS — I77810 Thoracic aortic ectasia: Secondary | ICD-10-CM

## 2024-07-01 NOTE — Telephone Encounter (Signed)
 Called and spoke with patient regarding her recent CT results. Patient verbalized understanding.

## 2024-07-06 NOTE — Telephone Encounter (Signed)
 The patient has been notified of the result and verbalized understanding.  All questions (if any) were answered. Aleck LOISE Bill, RN 07/06/2024 3:54 PM  CT of aorta ordered for 2 years out. Results sent to PCP

## 2024-07-06 NOTE — Addendum Note (Signed)
 Addended by: Pattiann Solanki N on: 07/06/2024 03:54 PM   Modules accepted: Orders

## 2024-07-06 NOTE — Telephone Encounter (Signed)
-----   Message from Lynwood Schilling, MD sent at 07/04/2024  4:26 AM EST ----- I believe the patient has been notified.  Stable enlarged aorta.  We can repeat a study in tow years.  Please schedule this.  Call Ms. Goebel with the results and send results to McComb, John, MD
